# Patient Record
Sex: Female | Born: 2000 | Hispanic: Yes | Marital: Single | Smoking: Never smoker
Health system: Southern US, Community
[De-identification: ages and names within clinical notes are randomized; demographics above are authoritative.]

---
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
## 2021-03-23 ENCOUNTER — Other Ambulatory Visit: Payer: Self-pay

## 2021-03-23 ENCOUNTER — Ambulatory Visit (INDEPENDENT_AMBULATORY_CARE_PROVIDER_SITE_OTHER): Payer: Self-pay | Admitting: Certified Nurse Midwife

## 2021-03-23 ENCOUNTER — Encounter: Payer: Self-pay | Admitting: Certified Nurse Midwife

## 2021-03-23 VITALS — BP 107/73 | HR 71 | Ht 63.0 in | Wt 166.1 lb

## 2021-03-23 DIAGNOSIS — Z30430 Encounter for insertion of intrauterine contraceptive device: Secondary | ICD-10-CM

## 2021-03-23 DIAGNOSIS — Z3200 Encounter for pregnancy test, result unknown: Secondary | ICD-10-CM

## 2021-03-23 LAB — POCT URINE PREGNANCY: Preg Test, Ur: NEGATIVE

## 2021-03-23 NOTE — Progress Notes (Signed)
    GYNECOLOGY OFFICE PROCEDURE NOTE  Melissa Gamble is a 20 y.o. No obstetric history on file. here for para guard IUD insertion. No GYN concerns.    IUD Insertion Procedure Note Patient identified, informed consent performed, consent signed.   Discussed risks of irregular bleeding, cramping, infection, malpositioning or misplacement of the IUD outside the uterus which may require further procedure such as laparoscopy. Also discussed >99% contraception efficacy, increased risk of ectopic pregnancy with failure of method.   Emphasized that this did not protect against STIs, condoms recommended during all sexual encounters. Time out was performed.  Urine pregnancy test negative.  Speculum placed in the vagina.  Cervix visualized.  Cleaned with Betadine x 2.  Grasped anteriorly with a single tooth tenaculum.  Uterus sounded to 7 cm.  ParaGaurd IUD placed per manufacturer's recommendations.  Strings trimmed to 3 cm. Tenaculum was removed, good hemostasis noted.  Patient tolerated procedure well.   Patient was given post-procedure instructions.  She was advised to have backup contraception for one week.  Patient was also asked to check IUD strings periodically and follow up in 4 weeks for IUD check.   Doreene Burke, CNM

## 2021-03-23 NOTE — Patient Instructions (Signed)

## 2021-05-05 ENCOUNTER — Other Ambulatory Visit: Payer: Self-pay

## 2021-05-05 ENCOUNTER — Ambulatory Visit (INDEPENDENT_AMBULATORY_CARE_PROVIDER_SITE_OTHER): Payer: Self-pay | Admitting: Certified Nurse Midwife

## 2021-05-05 ENCOUNTER — Encounter: Payer: Self-pay | Admitting: Certified Nurse Midwife

## 2021-05-05 DIAGNOSIS — Z30431 Encounter for routine checking of intrauterine contraceptive device: Secondary | ICD-10-CM

## 2021-05-05 NOTE — Progress Notes (Signed)
    GYNECOLOGY OFFICE ENCOUNTER NOTE  History:  20 y.o. No obstetric history on file. here today for today for IUD string check; Paragard  IUD was placed  03/23/21. No complaints about the IUD, no concerning side effects.  The following portions of the patient's history were reviewed and updated as appropriate: allergies, current medications, past family history, past medical history, past social history, past surgical history and problem list.  Review of Systems:  Pertinent items are noted in HPI.   Objective:  Physical Exam Last menstrual period 04/25/2021. CONSTITUTIONAL: Well-developed, well-nourished female in no acute distress.  NEUROLOGIC: Alert and oriented to person, place, and time. Normal reflexes, muscle tone coordination.  PSYCHIATRIC: Normal mood and affect. Normal behavior. Normal judgment and thought content. CARDIOVASCULAR: Normal heart rate noted RESPIRATORY: Effort and breath sounds normal, no problems with respiration noted ABDOMEN: Soft, no distention noted.   PELVIC: Normal appearing external genitalia; normal appearing vaginal mucosa and cervix.  IUD strings visualized, about 3 cm in length outside cervix.   Assessment & Plan:  Patient to keep IUD in place for up to 10 years; can come in for removal if she desires pregnancy earlier or for any concerning side effects.  Doreene Burke, CNM

## 2021-05-05 NOTE — Patient Instructions (Signed)

## 2021-06-26 ENCOUNTER — Emergency Department: Payer: Self-pay

## 2021-06-26 ENCOUNTER — Emergency Department
Admission: EM | Admit: 2021-06-26 | Discharge: 2021-06-27 | Disposition: A | Discharge status: Home / Self Care | Payer: Self-pay | Attending: Emergency Medicine | Admitting: Emergency Medicine

## 2021-06-26 ENCOUNTER — Other Ambulatory Visit: Payer: Self-pay

## 2021-06-26 ENCOUNTER — Encounter: Payer: Self-pay | Admitting: Emergency Medicine

## 2021-06-26 DIAGNOSIS — W208XXA Other cause of strike by thrown, projected or falling object, initial encounter: Secondary | ICD-10-CM | POA: Insufficient documentation

## 2021-06-26 DIAGNOSIS — Y990 Civilian activity done for income or pay: Secondary | ICD-10-CM | POA: Insufficient documentation

## 2021-06-26 DIAGNOSIS — S9032XA Contusion of left foot, initial encounter: Secondary | ICD-10-CM | POA: Insufficient documentation

## 2021-06-26 DIAGNOSIS — Y93E5 Activity, floor mopping and cleaning: Secondary | ICD-10-CM | POA: Insufficient documentation

## 2021-06-26 DIAGNOSIS — M79672 Pain in left foot: Secondary | ICD-10-CM | POA: Insufficient documentation

## 2021-06-26 MED ORDER — MELOXICAM 15 MG PO TABS: 15.0000 mg | ORAL_TABLET | Freq: Every day | ORAL | 0 refills | Status: AC

## 2021-06-26 MED ORDER — MELOXICAM 7.5 MG PO TABS
15.0000 mg | ORAL_TABLET | Freq: Once | ORAL | Status: AC
  Administered 2021-06-26: 15 mg via ORAL
  Filled 2021-06-26: qty 2

## 2021-06-26 NOTE — ED Provider Notes (Signed)
[facility] Emergency Department Provider Note  ____________________________________________  Time seen: Approximately 11:25 PM  I have reviewed the triage vital signs and the nursing notes.   HISTORY  Chief Complaint Foot Pain    HPI Melissa Gamble is a 20 y.o. female who presents emergency department complaining of left foot injury.  Patient dropped a table on her foot at work.  She has bruising, pain and edema to the left foot.  Unable to bear weight currently.  No other injury or complaint.       History reviewed. No pertinent past medical history.  There are no problems to display for this patient.   History reviewed. No pertinent surgical history.  Prior to Admission medications   Medication Sig Start Date End Date Taking? Authorizing Provider  meloxicam (MOBIC) 15 MG tablet Take 1 tablet (15 mg total) by mouth daily. 06/26/21  Yes Sascha Palma, Delorise Royals, PA-C  PARAGARD INTRAUTERINE COPPER IU by Intrauterine route.    [provider]    Allergies Patient has no allergy information on record.  No family history on file.  Social History     Review of Systems  Constitutional: No fever/chills Eyes: No visual changes. No discharge ENT: No upper respiratory complaints. Cardiovascular: no chest pain. Respiratory: no cough. No SOB. Gastrointestinal: No abdominal pain.  No nausea, no vomiting.  No diarrhea.  No constipation. Musculoskeletal: Left foot pain/injury Skin: Negative for rash, abrasions, lacerations, ecchymosis. Neurological: Negative for headaches, focal weakness or numbness.  10 System ROS otherwise negative.  ____________________________________________   PHYSICAL EXAM:  VITAL SIGNS: ED Triage Vitals  Enc Vitals Group     BP 06/26/21 1930 120/76     Pulse Rate 06/26/21 1930 (!) 105     Resp 06/26/21 1930 16     Temp 06/26/21 1930 97.7 F (36.5 C)     Temp Source 06/26/21 1930 Oral     SpO2 06/26/21  1930 98 %     Weight 06/26/21 1913 160 lb (72.6 kg)     Height 06/26/21 1913 5\' 3"  (1.6 m)     Head Circumference --      Peak Flow --      Pain Score 06/26/21 1912 3     Pain Loc --      Pain Edu? --      Excl. in GC? --      Constitutional: Alert and oriented. Well appearing and in no acute distress. Eyes: Conjunctivae are normal. PERRL. EOMI. Head: Atraumatic. ENT:      Ears:       Nose: No congestion/rhinnorhea.      Mouth/Throat: Mucous membranes are moist.  Neck: No stridor.    Cardiovascular: Normal rate, regular rhythm. Normal S1 and S2.  Good peripheral circulation. Respiratory: Normal respiratory effort without tachypnea or retractions. Lungs CTAB. Good air entry to the bases with no decreased or absent breath sounds. Musculoskeletal: Full range of motion to all extremities. No gross deformities appreciated.  Visualization of the left foot reveals mild edema and ecchymosis along the midfoot just proximal to the MTP joint of the third, fourth and fifth digits.  No open wounds.  Very tender to palpation with no palpable abnormality.  Dorsalis pedis pulse intact.  Sensation intact all digits. Neurologic:  Normal speech and language. No gross focal neurologic deficits are appreciated.  Skin:  Skin is warm, dry and intact. No rash noted. Psychiatric: Mood and affect are normal. Speech and behavior are normal. Patient exhibits  appropriate insight and judgement.   ____________________________________________   LABS (all labs ordered are listed, but only abnormal results are displayed)  Labs Reviewed - No data to display ____________________________________________  EKG   ____________________________________________  RADIOLOGY I personally viewed and evaluated these images as part of my medical decision making, as well as reviewing the written report by the radiologist.  ED Provider Interpretation: No acute traumatic findings on x-ray of the left foot  DG Foot Complete  Left  Result Date: 06/26/2021 CLINICAL DATA:  Cough table on foot EXAM: LEFT FOOT - COMPLETE 3+ VIEW COMPARISON:  None. FINDINGS: Frontal, oblique, and lateral views of the left foot are obtained. No fracture, subluxation, or dislocation. Joint spaces are well preserved. Soft tissues are normal. IMPRESSION: 1. Unremarkable left foot. Electronically Signed   By: Sharlet Salina M.D.   On: 06/26/2021 19:33    ____________________________________________    PROCEDURES  Procedure(s) performed:    Procedures    Medications  meloxicam (MOBIC) tablet 15 mg (has no administration in time range)     ____________________________________________   INITIAL IMPRESSION / ASSESSMENT AND PLAN / ED COURSE  Pertinent labs & imaging results that were available during my care of the patient were reviewed by me and considered in my medical decision making (see chart for details).  Review of the [place] CSRS was performed in accordance of the [facility] prior to dispensing any controlled drugs.           Patient's diagnosis is consistent with left foot contusion.  Patient presented to emergency department after dropping a table on her left foot.  Ecchymosis and edema was identified.  Imaging revealed no acute fracture.  Meloxicam at home for symptom relief.  Postop shoe and crutches for ambulation.  Follow-up primary care as needed..  Patient is given ED precautions to return to the ED for any worsening or new symptoms.     ____________________________________________  FINAL CLINICAL IMPRESSION(S) / ED DIAGNOSES  Final diagnoses:  Contusion of left foot, initial encounter      NEW MEDICATIONS STARTED DURING THIS VISIT:  ED Discharge Orders          Ordered    meloxicam (MOBIC) 15 MG tablet  Daily        06/26/21 2327                This chart was dictated using voice recognition software/Dragon. Despite best efforts to proofread, errors can occur which can change the meaning. Any  change was purely unintentional.    Racheal Patches, PA-C 06/26/21 2327    Concha Se, MD 06/28/21 [phone]

## 2021-06-26 NOTE — ED Notes (Signed)
Pt works at [employer]  [address] [place], [place], [place] [address] [phone]

## 2021-06-26 NOTE — ED Notes (Signed)
See triage note. Pt states corner of table fell on her L foot. Bruising and slight swelling noted at top of L foot; pt reports took tylenol and ice after event. States tylenol did not help. Pt can wiggle al toes; foot warm; dorsalis pedis pulse 2+. Visitor remains at bedside.

## 2021-06-26 NOTE — ED Provider Notes (Signed)
Emergency Medicine Provider Triage Evaluation Note  Nafeesah Lapaglia , a 20 y.o. female  was evaluated in triage.  Pt complains of left foot pain after dropping a table on her left foot.  Patient has bruising and pain to the foot..  Review of Systems  Positive: Left foot injury/pain Negative: No other injury or complaint  Physical Exam  Ht 5\' 3"  (1.6 m)   Wt 72.6 kg   LMP 05/29/2021 (Approximate)   BMI 28.34 kg/m  Gen:   Awake, no distress   Resp:  Normal effort  MSK:   Visualization of the left foot reveals edema and ecchymosis about the foot proximal to the third, fourth, fifth digits.  No open wounds. Other:    Medical Decision Making  Medically screening exam initiated at 7:15 PM.  Appropriate orders placed.  Roisin Kelleen Stolze was informed that the remainder of the evaluation will be completed by another provider, this initial triage assessment does not replace that evaluation, and the importance of remaining in the ED until their evaluation is complete.  Patient presents with foot injury.  Will order x-ray at this time.   Isaac Bliss 06/26/21 1916    06/28/21, MD 06/26/21 06/28/21

## 2021-06-26 NOTE — ED Triage Notes (Signed)
Pt presents to ER from work reports she was cleaning a table and table fell on her left foot. Pt has visible swelling to area. Pt talks in complete sentences no distress noted.

## 2022-04-01 ENCOUNTER — Ambulatory Visit (INDEPENDENT_AMBULATORY_CARE_PROVIDER_SITE_OTHER): Payer: Self-pay | Admitting: Certified Nurse Midwife

## 2022-04-01 ENCOUNTER — Encounter: Payer: Self-pay | Admitting: Certified Nurse Midwife

## 2022-04-01 VITALS — BP 104/69 | HR 89 | Ht 63.0 in | Wt 160.0 lb

## 2022-04-01 DIAGNOSIS — Z30432 Encounter for removal of intrauterine contraceptive device: Secondary | ICD-10-CM

## 2022-04-01 NOTE — Progress Notes (Signed)
  GYNECOLOGY OFFICE PROCEDURE NOTE  Melissa Gamble is a 21 y.o. No obstetric history on file. here for  IUD removal. No GYN concerns.   IUD Removal  Patient identified, informed consent performed, consent signed.  Patient was placed in the dorsal lithotomy position, normal external genitalia was noted.  A speculum was placed in the patient's vagina, normal discharge was noted, no lesions. The cervix was visualized, no lesions, no abnormal discharge.  The strings of the IUD were grasped and pulled using ring forceps. The IUD was removed in its entirety.  (  Patient tolerated the procedure well.    Patient plans for pregnancy soon and she was told to avoid teratogens, take PNV and folic acid.  Routine preventative health maintenance measures emphasized.   Doreene Burke, CNM

## 2022-04-01 NOTE — Patient Instructions (Signed)
Preparing for Pregnancy If you are planning to become pregnant, talk to your health care provider about preconception care. This type of care helps you prepare for a safe and healthy pregnancy. During this visit, your health care provider will: Do a complete physical exam, including a Pap test. Take your complete medical history. Give you information, answer your questions, and help you resolve problems. Preconception checklist Medical history Tell your health care provider about any medical conditions you have or have had. Your pregnancy or your ability to become pregnant may be affected by long-term (chronic) conditions, such as: Diabetes. High blood pressure (hypertension). Thyroid problems. Tell your health care provider about your family's medical history and your partner's medical history. Tell your health care provider if you have or have had any sexually transmitted infections, orSTIs. These can affect your pregnancy. In some cases, they can be passed to your baby. If needed, discuss the benefits of genetic testing. This test checks for conditions that may be passed from parent to child. Tell your health care provider about: Any problems you had getting pregnant or while pregnant. Any medicines you take. These include vitamins, herbal supplements, and over-the-counter medicines. Your history of getting vaccines. Discuss any vaccines that you may need. Diet Ask your health care provider about what foods to eat in order to get a balance of nutrients. This is especially important when you are pregnant or preparing to become pregnant. It is recommended that women of childbearing age take a folic acid supplement of 400 mcg daily and eat foods rich in folic acid to prevent certain birth defects. Ask your health care provider to help you reach a healthy weight before pregnancy. If you are overweight, you may have a higher risk for certain problems. These include hypertension, diabetes, and  early (preterm) birth. If you are underweight, you are more likely to have a baby who has a low birth weight. Lifestyle, work, and home Let your health care provider know about: Any lifestyle habits that you have, such as use of alcohol, drugs, or tobacco products. Fun and leisure activities that may put you at risk during pregnancy, such as downhill skiing and certain exercise programs. Any plans to travel out of the country, especially to places with an active Zika virus outbreak. Harmful substances that you may be exposed to at work or at home. These include chemicals, pesticides, radiation, and substances from cat litter boxes. Any concerns you have for your safety at home. Mental health Tell your health care provider about: Any history of mental health conditions, including feelings of depression, sadness, or anxiety. Any medicines that you take for a mental health condition. These include herbs and supplements. How do I know that I am pregnant? You may be pregnant if you have been sexually active and you miss your period. Other symptoms of early pregnancy include: Mild cramping. Very light vaginal bleeding (spotting). Feeling more tired than usual. Nausea and vomiting. These may be signs of morning sickness. Take a home pregnancy test if you have any of these symptoms. This test checks for a hormone in your urine called human chorionic gonadotropin, or hCG. A woman's body begins to make this hormone during early pregnancy. These tests are very accurate. Wait until at least the first day after you miss your period to take a home pregnancy test. If the test shows that you are pregnant, call your health care provider for a prenatal care visit. What should I do if I become pregnant? Schedule a   visit with your health care provider as soon as you suspect you are pregnant. Talk to your health care provider if you are taking prescription medicines to determine if they are safe to take during  pregnancy. You may continue to have sex if it does not cause pain or other problems, such as vaginal bleeding. Follow these instructions at home: Eating and drinking  Follow instructions from your health care provider about eating or drinking restrictions. Drink enough fluid to keep your urine pale yellow. Eat a balanced diet. This includes fresh fruits and vegetables, whole grains, lean meats, low-fat dairy products, healthy fats, and foods that are high in fiber. Ask to meet with a nutritionist or registered dietitian for help with meal planning and goals. Avoid eating raw or undercooked meat and seafood. Avoid eating or drinking unpasteurized dairy products. Lifestyle     Get regular exercise. Try to be active for at least 30 minutes a day on most days of the week. Ask your health care provider which activities are safe during pregnancy. Maintain a healthy weight. Avoid toxic fumes and chemicals. Avoid cleaning cat litter boxes. Cat feces may contain a harmful parasite called toxoplasma. Avoid travel to countries where Zika virus is common. Do not use any products that contain nicotine or tobacco, such as cigarettes, e-cigarettes, and chewing tobacco. If you need help quitting, ask your health care provider. Do not drink alcohol or use drugs. General instructions Keep an accurate record of your menstrual periods. This makes it easier for your health care provider to determine your baby's due date. Take over-the-counter and prescription medicines only as told by your health care provider. Begin taking prenatal vitamins and folic acid supplements daily as directed. Manage any chronic conditions, such as hypertension and diabetes, as told by your health care provider. This is important. Summary If you are planning to become pregnant, talk to your health care provider about preconception care. This is an important part of planning for a healthy pregnancy. Women of childbearing age should  take 400 mcg of folic acid daily in addition to eating a diet rich in folic acid. This will prevent certain birth defects. Schedule a visit with your health care provider as soon as you suspect you are pregnant. Tell your health care provider about your medical history, lifestyle activities, home safety, and other things that may concern you. This information is not intended to replace advice given to you by your health care provider. Make sure you discuss any questions you have with your health care provider. Document Revised: 06/13/2019 Document Reviewed: 06/13/2019 Elsevier Patient Education  2023 [employer] Inc.  

## 2022-06-03 ENCOUNTER — Emergency Department
Admission: EM | Admit: 2022-06-03 | Discharge: 2022-06-04 | Disposition: A | Discharge status: Home / Self Care | Payer: Self-pay | Attending: Emergency Medicine | Admitting: Emergency Medicine

## 2022-06-03 ENCOUNTER — Emergency Department: Payer: Self-pay

## 2022-06-03 ENCOUNTER — Other Ambulatory Visit: Payer: Self-pay

## 2022-06-03 ENCOUNTER — Encounter: Payer: Self-pay | Admitting: Emergency Medicine

## 2022-06-03 DIAGNOSIS — O200 Threatened abortion: Secondary | ICD-10-CM | POA: Insufficient documentation

## 2022-06-03 DIAGNOSIS — O209 Hemorrhage in early pregnancy, unspecified: Secondary | ICD-10-CM | POA: Insufficient documentation

## 2022-06-03 DIAGNOSIS — N939 Abnormal uterine and vaginal bleeding, unspecified: Secondary | ICD-10-CM

## 2022-06-03 DIAGNOSIS — Z3A01 Less than 8 weeks gestation of pregnancy: Secondary | ICD-10-CM | POA: Insufficient documentation

## 2022-06-03 DIAGNOSIS — R110 Nausea: Secondary | ICD-10-CM | POA: Insufficient documentation

## 2022-06-03 LAB — URINALYSIS, ROUTINE W REFLEX MICROSCOPIC
Bacteria, UA: NONE SEEN
Bilirubin Urine: NEGATIVE
Glucose, UA: NEGATIVE mg/dL
Ketones, ur: NEGATIVE mg/dL
Leukocytes,Ua: NEGATIVE
Nitrite: NEGATIVE
Protein, ur: 30 mg/dL — AB
RBC / HPF: >50 RBC/hpf — ABNORMAL HIGH (ref 0–5)
Specific Gravity, Urine: 1.01 (ref 1.005–1.030)
pH: 8 (ref 5.0–8.0)

## 2022-06-03 LAB — CBC
HCT: 36.2 % (ref 36.0–46.0)
Hemoglobin: 11.7 g/dL — ABNORMAL LOW (ref 12.0–15.0)
MCH: 28.2 pg (ref 26.0–34.0)
MCHC: 32.3 g/dL (ref 30.0–36.0)
MCV: 87.2 fL (ref 80.0–100.0)
Platelets: 345 10*3/uL (ref 150–400)
RBC: 4.15 MIL/uL (ref 3.87–5.11)
RDW: 13.3 % (ref 11.5–15.5)
WBC: 7.9 10*3/uL (ref 4.0–10.5)
nRBC: 0 % (ref 0.0–0.2)

## 2022-06-03 LAB — COMPREHENSIVE METABOLIC PANEL WITH GFR
ALT: 14 U/L (ref 0–44)
AST: 21 U/L (ref 15–41)
Albumin: 4.2 g/dL (ref 3.5–5.0)
Alkaline Phosphatase: 82 U/L (ref 38–126)
Anion gap: 6 (ref 5–15)
BUN: 11 mg/dL (ref 6–20)
CO2: 25 mmol/L (ref 22–32)
Calcium: 9.2 mg/dL (ref 8.9–10.3)
Chloride: 106 mmol/L (ref 98–111)
Creatinine, Ser: 0.56 mg/dL (ref 0.44–1.00)
GFR, Estimated: >60 mL/min (ref >60)
Glucose, Bld: 94 mg/dL (ref 70–99)
Potassium: 3.8 mmol/L (ref 3.5–5.1)
Sodium: 137 mmol/L (ref 135–145)
Total Bilirubin: 0.7 mg/dL (ref 0.3–1.2)
Total Protein: 7.6 g/dL (ref 6.5–8.1)

## 2022-06-03 LAB — ABO/RH: ABO/RH(D): O POS

## 2022-06-03 LAB — HCG, QUANTITATIVE, PREGNANCY: hCG, Beta Chain, Quant, S: 25 m[IU]/mL — ABNORMAL HIGH (ref <5)

## 2022-06-03 LAB — POC URINE PREG, ED: Preg Test, Ur: NEGATIVE

## 2022-06-03 NOTE — ED Triage Notes (Signed)
Pt to ED from home c/o vaginal bleeding.  States took home pregnancy test yesterday that was positive, has had some continuous intermittent spotting today with one quarter sized clot.  States first pregnancy.

## 2022-06-03 NOTE — Discharge Instructions (Addendum)
Your urine pregnancy result is negative. Your blood pregnancy level is 25. Do not use tampons, douche or have sexual intercourse until seen by your doctor. Return to the ER for worsening symptoms, persistent vomiting, soaking more than 1 pad per hour or other concerns.

## 2022-06-03 NOTE — ED Provider Notes (Signed)
[facility] Provider Note    Event Date/Time   First MD Initiated Contact with Patient 06/03/22 2301     (approximate)   History   Vaginal Bleeding   HPI  Melissa Gamble is a 21 y.o. female  who presents to the ED from work with a chief complaint of vaginal bleeding.  Patient took several home pregnancy test which were positive and the most recent one after she began to bleed was negative.  This would make her first pregnancy.  LMP August 2.  Endorses vaginal spotting x2 to 3 days; tonight passed a quarter sized clot.  Last sexual intercourse approximately 5 weeks ago.  Endorses nausea without vomiting.  Denies fever, chills, chest pain, shortness of breath, abdominal pain, dysuria or dizziness.     Past Medical History  History reviewed. No pertinent past medical history.   Active Problem List  There are no problems to display for this patient.    Past Surgical History  History reviewed. No pertinent surgical history.   Home Medications   Prior to Admission medications   Medication Sig Start Date End Date Taking? Authorizing Provider  meloxicam (MOBIC) 15 MG tablet Take 1 tablet (15 mg total) by mouth daily. 06/26/21   Cuthriell, Delorise Royals, PA-C     Allergies  Patient has no known allergies.   Family History  History reviewed. No pertinent family history.   Physical Exam  Triage Vital Signs: ED Triage Vitals  Enc Vitals Group     BP 06/03/22 2149 (!) 130/90     Pulse Rate 06/03/22 2149 77     Resp 06/03/22 2149 16     Temp 06/03/22 2149 98.6 F (37 C)     Temp Source 06/03/22 2149 Oral     SpO2 06/03/22 2149 95 %     Weight 06/03/22 2150 160 lb (72.6 kg)     Height 06/03/22 2150 5\' 3"  (1.6 m)     Head Circumference --      Peak Flow --      Pain Score 06/03/22 2149 0     Pain Loc --      Pain Edu? --      Excl. in GC? --     Updated Vital Signs: BP (!) 130/90 (BP Location: Left Arm)   Pulse 77   Temp 98.6 F (37  C) (Oral)   Resp 16   Ht 5\' 3"  (1.6 m)   Wt 72.6 kg   LMP 04/28/2022 (Exact Date)   SpO2 95%   BMI 28.34 kg/m    General: Awake, no distress.  CV:  RRR.  Good peripheral perfusion.  Resp:  Normal effort.  CTA B. Abd:  Nontender to light or deep palpation.  No distention.  Other:  No truncal vesicles.   ED Results / Procedures / Treatments  Labs (all labs ordered are listed, but only abnormal results are displayed) Labs Reviewed  HCG, QUANTITATIVE, PREGNANCY - Abnormal; Notable for the following components:      Result Value   hCG, Beta Chain, Quant, S 25 (*)    All other components within normal limits  CBC - Abnormal; Notable for the following components:   Hemoglobin 11.7 (*)    All other components within normal limits  URINALYSIS, ROUTINE W REFLEX MICROSCOPIC - Abnormal; Notable for the following components:   Color, Urine YELLOW (*)    APPearance CLEAR (*)    Hgb urine dipstick LARGE (*)  Protein, ur 30 (*)    RBC / HPF >50 (*)    All other components within normal limits  COMPREHENSIVE METABOLIC PANEL  POC URINE PREG, ED  ABO/RH     EKG  None   RADIOLOGY I have independently visualized and interpreted patient's ultrasound as well as noted the radiology interpretation:  Ultrasound: No IUP visualized  Official radiology report(s): US OB Comp Less 14 Wks  Result Date: 06/04/2022 CLINICAL DATA:  Pregnant, vaginal bleeding, beta HCG 25 EXAM: OBSTETRIC <14 WK ULTRASOUND TECHNIQUE: Transabdominal ultrasound was performed for evaluation of the gestation as well as the maternal uterus and adnexal regions. COMPARISON:  None Available. FINDINGS: Intrauterine gestational sac: None Maternal uterus/adnexae: Endometrial complex measures 5 mm. Bilateral ovaries are within normal limits. Trace pelvic fluid. IMPRESSION: No IUP is visualized. This is not unexpected given the low beta HCG. By definition, in the setting of a positive pregnancy test, this reflects a pregnancy  of unknown location. Differential considerations include early normal IUP, abnormal IUP/missed abortion, or nonvisualized ectopic pregnancy. Consider follow-up beta HCG supplemented by repeat sonography in 14 days as clinically warranted. Electronically Signed   By: Charline Bills M.D.   On: 06/04/2022 00:19     PROCEDURES:  Critical Care performed: No  Procedures Pelvic deferred per patient's request  MEDICATIONS ORDERED IN ED: Medications - No data to display   IMPRESSION / MDM / ASSESSMENT AND PLAN / ED COURSE  I reviewed the triage vital signs and the nursing notes.                             21 year old female approximately [redacted] weeks pregnant by dates presenting with vaginal spotting. Differential diagnosis includes, but is not limited to, ovarian cyst, ovarian torsion, acute appendicitis, diverticulitis, urinary tract infection/pyelonephritis, endometriosis, bowel obstruction, colitis, renal colic, gastroenteritis, hernia, fibroids, endometriosis, pregnancy related pain including ectopic pregnancy, etc. I have personally reviewed patient's records and note her encounter for IUD removal on 04/01/2022.  Patient's presentation is most consistent with acute presentation with potential threat to life or bodily function.  Laboratory results demonstrate normal H/H, normal electrolytes, urine negative for infection.  ABO/Rh is pending.  Urine pregnancy is negative; beta-hCG is 25.  Discussed with patient would not expect to see anything on ultrasound but she wishes to proceed but transabdominal only; no transvaginal approach.  Clinical Course as of 06/04/22 0043  Fri Jun 04, 2022  0042 Patient's blood type is O+.  Updated patient on expected ultrasound results not demonstrating IUP.  She has an appointment with [facility] for follow-up.  Strict return precautions given.  Patient verbalizes understanding and agrees with plan of care. [JS]    Clinical Course User Index [JS] Irean Hong,  MD     FINAL CLINICAL IMPRESSION(S) / ED DIAGNOSES   Final diagnoses:  Vaginal bleeding  Threatened miscarriage     Rx / DC Orders   ED Discharge Orders     None        Note:  This document was prepared using Dragon voice recognition software and may include unintentional dictation errors.   Irean Hong, MD 06/04/22 [phone]

## 2022-06-04 NOTE — ED Notes (Signed)
Pt given ice chips

## 2022-06-04 NOTE — ED Notes (Signed)
Pt Dc to home. MD Dolores Frame at bedside to review Dc instructions. Pt verbalizes understanding. Pt ambulatory out of dept with steady gait

## 2022-06-15 ENCOUNTER — Encounter: Payer: Self-pay | Admitting: Certified Nurse Midwife

## 2022-06-15 ENCOUNTER — Ambulatory Visit (INDEPENDENT_AMBULATORY_CARE_PROVIDER_SITE_OTHER): Payer: Self-pay | Admitting: Certified Nurse Midwife

## 2022-06-15 VITALS — BP 107/73 | HR 77 | Ht 63.0 in | Wt 157.0 lb

## 2022-06-15 DIAGNOSIS — Z3009 Encounter for other general counseling and advice on contraception: Secondary | ICD-10-CM

## 2022-06-15 DIAGNOSIS — O021 Missed abortion: Secondary | ICD-10-CM

## 2022-06-15 LAB — POCT URINE PREGNANCY: Preg Test, Ur: NEGATIVE

## 2022-06-15 MED ORDER — NORETHIN ACE-ETH ESTRAD-FE 1-20 MG-MCG PO TABS: 1.0000 | ORAL_TABLET | Freq: Every day | ORAL | 11 refills | Status: DC

## 2022-06-15 NOTE — Progress Notes (Signed)
GYN ENCOUNTER NOTE  Subjective:       Melissa Gamble is a 21 y.o. G24P0010 female is here for gynecologic evaluation of the following issues:  1. Pt seen ED 9/7 for vaginal bleeding after having at home positive pregnancy test. Follow up ED. Marland Kitchen    Pt state she went to [facility] on 10 and had blood work that showed HCG level that went fro 25 to 5. She states the bleeding has stopped and she is interested in starting the pill.    Gynecologic History Patient's last menstrual period was 04/28/2022 (exact date). Contraception: none Last Pap: n/a .   Obstetric History OB History  Gravida Para Term Preterm AB Living  1       1    SAB IAB Ectopic Multiple Live Births  1       0    # Outcome Date GA Lbr Len/2nd Weight Sex Delivery Anes PTL Lv  1 SAB             No past medical history on file.  No past surgical history on file.  Current Outpatient Medications on File Prior to Visit  Medication Sig Dispense Refill   meloxicam (MOBIC) 15 MG tablet Take 1 tablet (15 mg total) by mouth daily. 30 tablet 0   No current facility-administered medications on file prior to visit.    No Known Allergies  Social History   Socioeconomic History   Marital status: Single    Spouse name: Not on file   Number of children: Not on file   Years of education: Not on file   Highest education level: Not on file  Occupational History   Not on file  Tobacco Use   Smoking status: Never    Passive exposure: Never   Smokeless tobacco: Never  Substance and Sexual Activity   Alcohol use: Not Currently   Drug use: Never   Sexual activity: Yes  Other Topics Concern   Not on file  Social History Narrative   Not on file   Social Determinants of Health   Financial Resource Strain: Not on file  Food Insecurity: Not on file  Transportation Needs: Not on file  Physical Activity: Not on file  Stress: Not on file  Social Connections: Not on file  Intimate Partner Violence: Not on file    No  family history on file.  The following portions of the patient's history were reviewed and updated as appropriate: allergies, current medications, past family history, past medical history, past social history, past surgical history and problem list.  Review of Systems Review of Systems - Negative except as mentioned in HPI Review of Systems - General ROS: negative for - chills, fatigue, fever, hot flashes, malaise or night sweats Hematological and Lymphatic ROS: negative for - bleeding problems or swollen lymph nodes Gastrointestinal ROS: negative for - abdominal pain, blood in stools, change in bowel habits and nausea/vomiting Musculoskeletal ROS: negative for - joint pain, muscle pain or muscular weakness Genito-Urinary ROS: negative for - change in menstrual cycle, dysmenorrhea, dyspareunia, dysuria, genital discharge, genital ulcers, hematuria, incontinence, irregular/heavy menses, nocturia or pelvic pain.   Objective:   BP 107/73   Pulse 77   Ht 5\' 3"  (1.6 m)   Wt 157 lb (71.2 kg)   LMP 04/28/2022 (Exact Date) Comment: recent misc.  Breastfeeding Unknown   BMI 27.81 kg/m  CONSTITUTIONAL: Well-developed, well-nourished female in no acute distress.  HENT:  Normocephalic, atraumatic.  NECK: Normal range of motion,  supple, no masses.  Normal thyroid.  SKIN: Skin is warm and dry. No rash noted. Not diaphoretic. No erythema. No pallor. NEUROLGIC: Alert and oriented to person, place, and time. PSYCHIATRIC: Normal mood and affect. Normal behavior. Normal judgment and thought content. CARDIOVASCULAR:Not Examined RESPIRATORY: Not Examined BREASTS: Not Examined ABDOMEN: Soft, non distended; Non tender.  No Organomegaly. PELVIC:not indicated MUSCULOSKELETAL: Normal range of motion. No tenderness.  No cyanosis, clubbing, or edema.  UPT negative   Assessment:   Miscarriage Contraceptive counseling     Plan:   PT states that her bleeding stopped on the 11th. She denies any other  concerns and is interested in bc options. Reviewed options. She would like to use the pill. She has done in the past. She denies any contraindications to use. Orders placed.   Doreene Burke, CNM

## 2022-06-30 ENCOUNTER — Ambulatory Visit: Payer: Self-pay

## 2022-09-19 IMAGING — CR DG FOOT COMPLETE 3+V*L*
3 series · 3 of 3 positions shown · non-contrast
Comparison: None.

CLINICAL DATA: Cough table on foot

EXAM:
LEFT FOOT - COMPLETE 3+ VIEW

[foot ap]
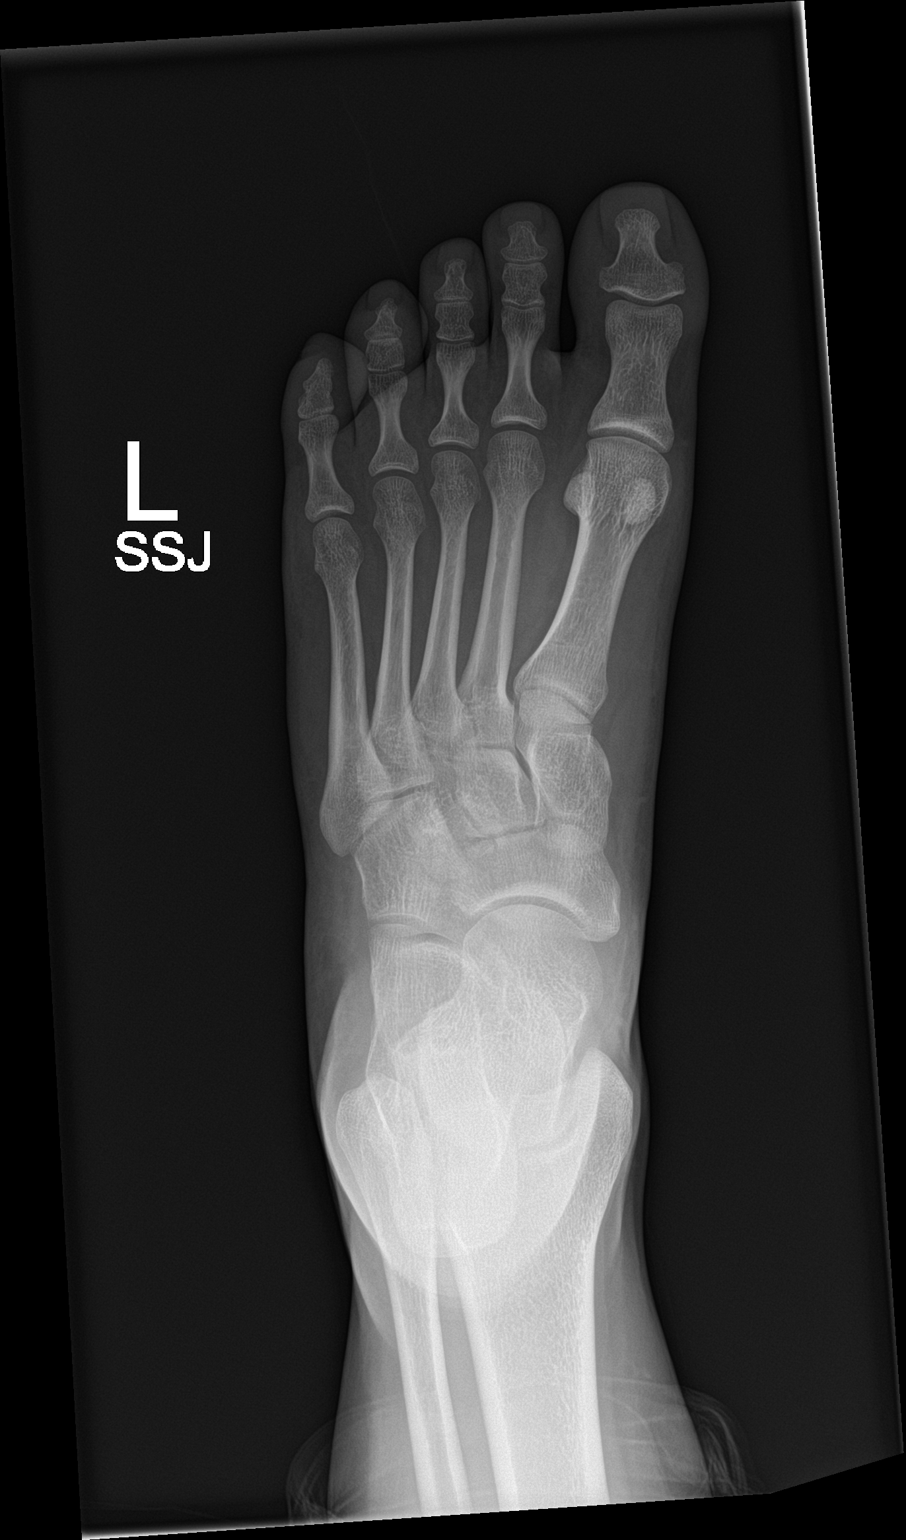

[foot obl]
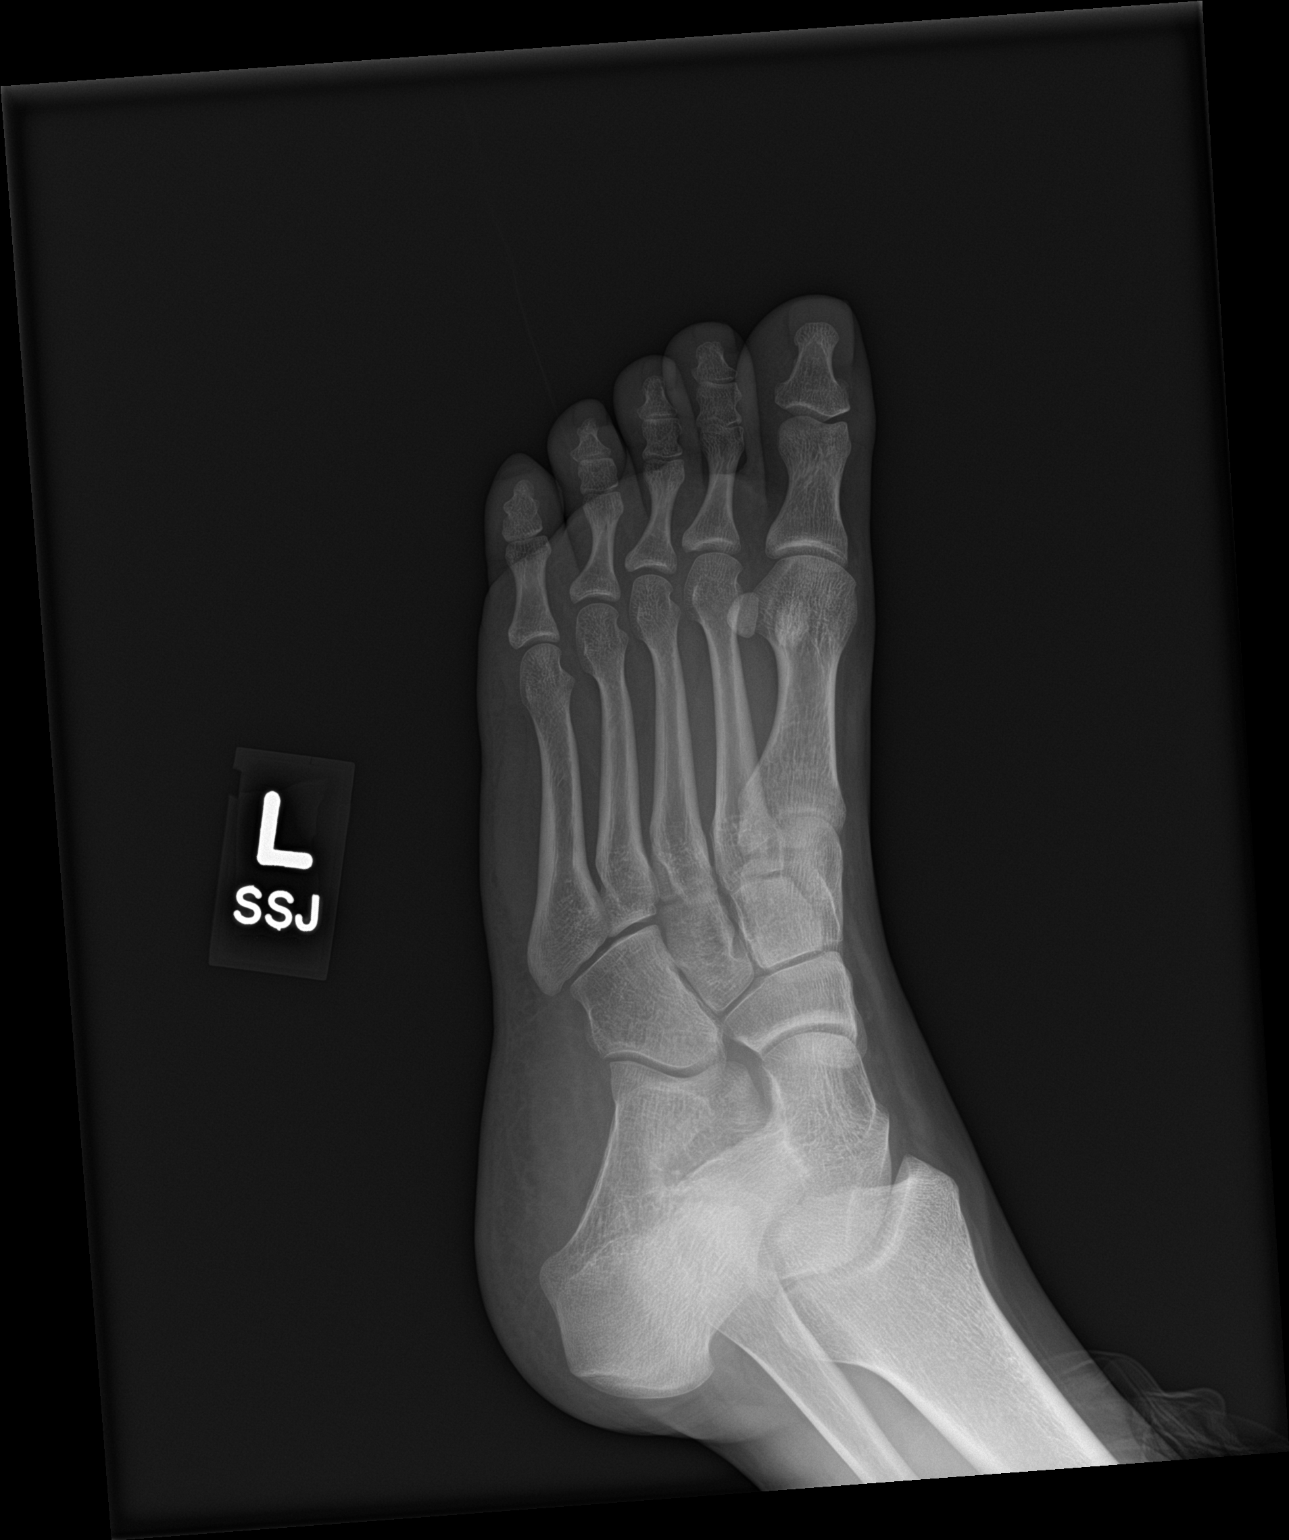

[foot lat]
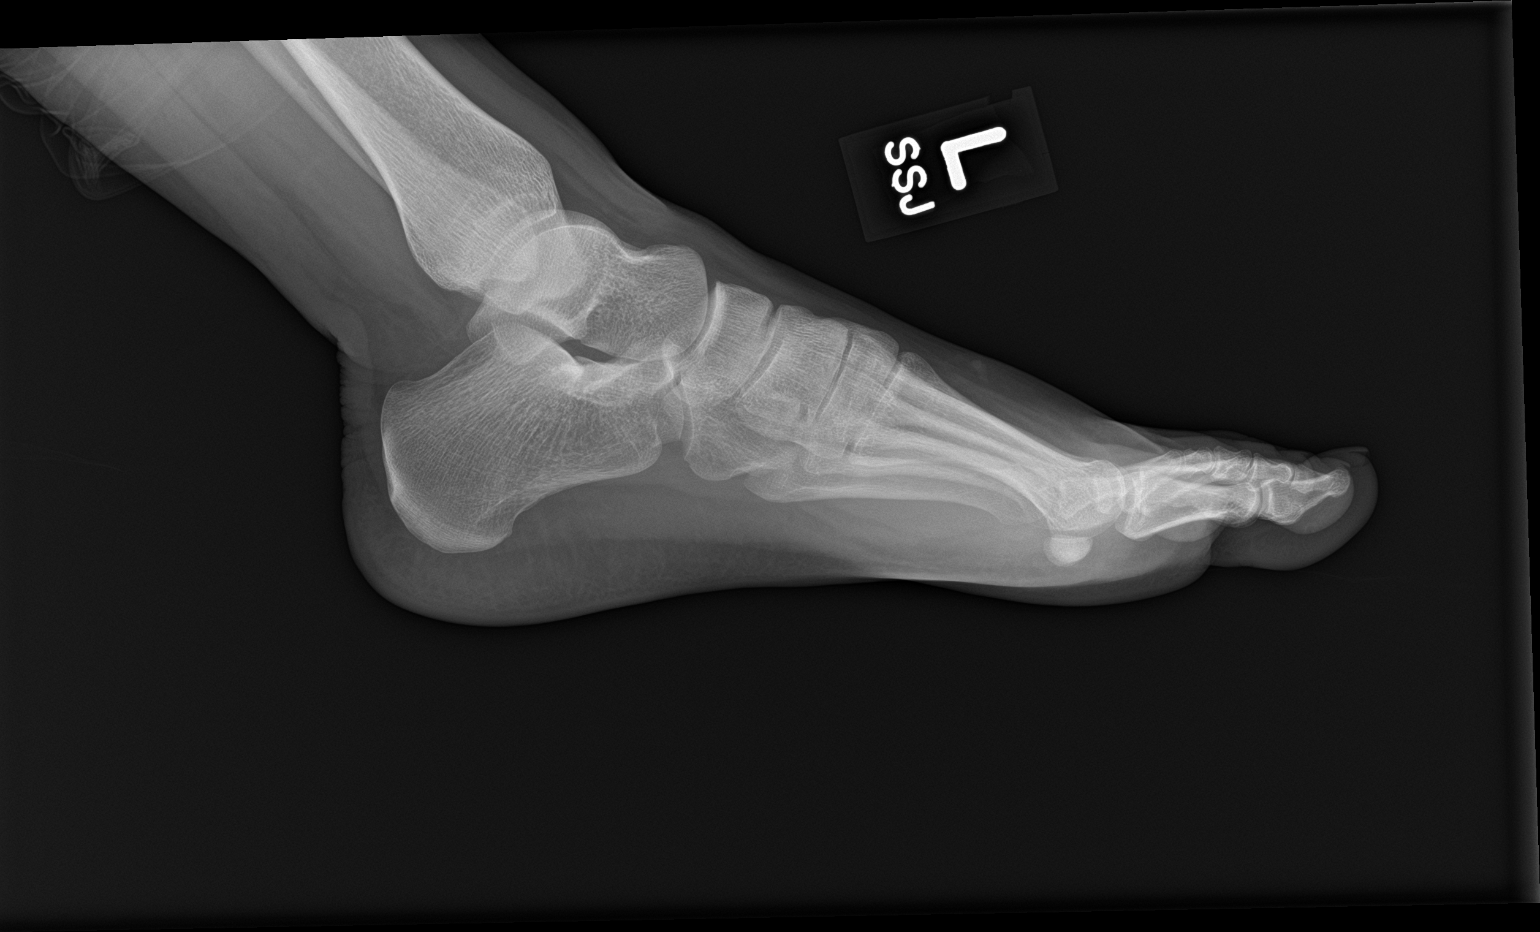

[3 of 3 positions shown; findings below may reference images not displayed]

FINDINGS: Frontal, oblique, and lateral views of the left foot are obtained.
No fracture, subluxation, or dislocation. Joint spaces are well
preserved. Soft tissues are normal.
IMPRESSION: 1. Unremarkable left foot.

## 2022-12-21 ENCOUNTER — Telehealth: Payer: Self-pay

## 2022-12-21 NOTE — Telephone Encounter (Signed)
Pt calling; ever since she had her copper IUD taken out her periods are not like they used to be - not as consistent, lighter and not as regular.  Used to have a regular period with a nice bleed and would like to have periods like that again.  Can her bc be changed or does she need an appt?  [phone]

## 2022-12-27 NOTE — Telephone Encounter (Signed)
[id number] 

## 2023-01-07 ENCOUNTER — Telehealth: Payer: Self-pay

## 2023-01-07 NOTE — Telephone Encounter (Signed)
I contacted the patient via phone. The patient is requesting to see any provider for birth control options. She is schedule with Missy on 5/23

## 2023-02-17 ENCOUNTER — Ambulatory Visit: Payer: Self-pay | Admitting: Obstetrics

## 2023-02-22 NOTE — Telephone Encounter (Signed)
Pt hasn't returned call.  Msg closed. 

## 2023-04-06 ENCOUNTER — Ambulatory Visit: Payer: Self-pay | Admitting: Certified Nurse Midwife

## 2023-05-13 ENCOUNTER — Other Ambulatory Visit: Payer: Self-pay

## 2023-05-13 ENCOUNTER — Other Ambulatory Visit: Payer: Self-pay | Admitting: Certified Nurse Midwife
